# Patient Record
Sex: Female | Born: 1970 | Race: White | Hispanic: No | Marital: Single
Health system: Southern US, Community
[De-identification: ages and names within clinical notes are randomized; demographics above are authoritative.]

---

## 2018-01-04 ENCOUNTER — Emergency Department (HOSPITAL_BASED_OUTPATIENT_CLINIC_OR_DEPARTMENT_OTHER): Payer: BLUE CROSS/BLUE SHIELD

## 2018-01-04 ENCOUNTER — Emergency Department (HOSPITAL_BASED_OUTPATIENT_CLINIC_OR_DEPARTMENT_OTHER)
Admission: EM | Admit: 2018-01-04 | Discharge: 2018-01-04 | Disposition: A | Discharge status: Home / Self Care | Payer: BLUE CROSS/BLUE SHIELD | Attending: Emergency Medicine | Admitting: Emergency Medicine

## 2018-01-04 ENCOUNTER — Other Ambulatory Visit: Payer: Self-pay

## 2018-01-04 DIAGNOSIS — R112 Nausea with vomiting, unspecified: Secondary | ICD-10-CM | POA: Diagnosis not present

## 2018-01-04 DIAGNOSIS — R42 Dizziness and giddiness: Secondary | ICD-10-CM

## 2018-01-04 DIAGNOSIS — R0981 Nasal congestion: Secondary | ICD-10-CM | POA: Diagnosis not present

## 2018-01-04 DIAGNOSIS — R1013 Epigastric pain: Secondary | ICD-10-CM

## 2018-01-04 DIAGNOSIS — R51 Headache: Secondary | ICD-10-CM | POA: Diagnosis present

## 2018-01-04 DIAGNOSIS — K2970 Gastritis, unspecified, without bleeding: Secondary | ICD-10-CM

## 2018-01-04 DIAGNOSIS — R040 Epistaxis: Secondary | ICD-10-CM

## 2018-01-04 DIAGNOSIS — R0789 Other chest pain: Secondary | ICD-10-CM | POA: Diagnosis not present

## 2018-01-04 DIAGNOSIS — K529 Noninfective gastroenteritis and colitis, unspecified: Secondary | ICD-10-CM | POA: Diagnosis not present

## 2018-01-04 DIAGNOSIS — R55 Syncope and collapse: Secondary | ICD-10-CM

## 2018-01-04 LAB — COMPREHENSIVE METABOLIC PANEL
ALBUMIN: 4.4 g/dL (ref 3.5–5.0)
ALK PHOS: 66 U/L (ref 38–126)
ALT: 28 U/L (ref 14–54)
ANION GAP: 8 (ref 5–15)
AST: 16 U/L (ref 15–41)
BILIRUBIN TOTAL: 0.2 mg/dL — AB (ref 0.3–1.2)
BUN: 12 mg/dL (ref 6–20)
CO2: 23 mmol/L (ref 22–32)
Calcium: 9 mg/dL (ref 8.9–10.3)
Chloride: 105 mmol/L (ref 101–111)
Creatinine, Ser: 0.81 mg/dL (ref 0.44–1.00)
GFR calc Af Amer: >60 mL/min (ref >60)
GFR calc non Af Amer: >60 mL/min (ref >60)
GLUCOSE: 135 mg/dL — AB (ref 65–99)
POTASSIUM: 3.7 mmol/L (ref 3.5–5.1)
SODIUM: 136 mmol/L (ref 135–145)
TOTAL PROTEIN: 7.3 g/dL (ref 6.5–8.1)

## 2018-01-04 LAB — CBC WITH DIFFERENTIAL/PLATELET
BASOS ABS: 0 10*3/uL (ref 0.0–0.1)
BASOS PCT: 1 %
EOS ABS: 0.4 10*3/uL (ref 0.0–0.7)
Eosinophils Relative: 6 %
HEMATOCRIT: 43.1 % (ref 36.0–46.0)
HEMOGLOBIN: 13.9 g/dL (ref 12.0–15.0)
Lymphocytes Relative: 25 %
Lymphs Abs: 1.6 10*3/uL (ref 0.7–4.0)
MCH: 30.2 pg (ref 26.0–34.0)
MCHC: 32.3 g/dL (ref 30.0–36.0)
MCV: 93.7 fL (ref 78.0–100.0)
Monocytes Absolute: 0.4 10*3/uL (ref 0.1–1.0)
Monocytes Relative: 6 %
NEUTROS ABS: 4.1 10*3/uL (ref 1.7–7.7)
NEUTROS PCT: 62 %
Platelets: 192 10*3/uL (ref 150–400)
RBC: 4.6 MIL/uL (ref 3.87–5.11)
RDW: 13.1 % (ref 11.5–15.5)
WBC: 6.6 10*3/uL (ref 4.0–10.5)

## 2018-01-04 LAB — URINALYSIS, ROUTINE W REFLEX MICROSCOPIC
Bilirubin Urine: NEGATIVE
GLUCOSE, UA: NEGATIVE mg/dL
Hgb urine dipstick: NEGATIVE
Ketones, ur: NEGATIVE mg/dL
LEUKOCYTES UA: NEGATIVE
NITRITE: NEGATIVE
PH: 7 (ref 5.0–8.0)
Protein, ur: NEGATIVE mg/dL

## 2018-01-04 LAB — LIPASE, BLOOD: Lipase: 39 U/L (ref 11–51)

## 2018-01-04 LAB — TROPONIN I: Troponin I: <0.03 ng/mL (ref <0.03)

## 2018-01-04 MED ORDER — FLUTICASONE PROPIONATE 50 MCG/ACT NA SUSP: 2.0000 | Freq: Every day | NASAL | 0 refills | Status: AC

## 2018-01-04 MED ORDER — SODIUM CHLORIDE 0.9 % IV BOLUS
2000.0000 mL | Freq: Once | INTRAVENOUS | Status: AC
  Administered 2018-01-04: 2000 mL via INTRAVENOUS

## 2018-01-04 MED ORDER — ONDANSETRON HCL 4 MG/2ML IJ SOLN
4.0000 mg | Freq: Once | INTRAMUSCULAR | Status: AC
  Administered 2018-01-04: 4 mg via INTRAVENOUS
  Filled 2018-01-04: qty 2

## 2018-01-04 MED ORDER — RANITIDINE HCL 150 MG PO TABS: 150.0000 mg | ORAL_TABLET | Freq: Two times a day (BID) | ORAL | 0 refills | Status: AC

## 2018-01-04 MED ORDER — ONDANSETRON 4 MG PO TBDP: 4.0000 mg | ORAL_TABLET | Freq: Three times a day (TID) | ORAL | 0 refills | Status: AC | PRN

## 2018-01-04 MED ORDER — GI COCKTAIL ~~LOC~~
30.0000 mL | Freq: Once | ORAL | Status: AC
  Administered 2018-01-04: 30 mL via ORAL
  Filled 2018-01-04: qty 30

## 2018-01-04 NOTE — Discharge Instructions (Addendum)
Your work up today has been very reassuring. You likely had a vasovagal episode, which could have been triggered by your nose bleed, which could have been from nasal congestion. Use flonase and a netipot to help flush your sinuses, may use over the counter antihistamines like zyrtec/claritin/benadryl as well for additional relief. Stay very well hydrated and get plenty of rest. Use zofran as directed as needed for nausea.   Your abdominal and chest pain could be from gastritis or an ulcer. You will need to take zantac as directed, and avoid spicy/fatty/acidic foods, avoid soda/coffee/tea/alcohol. Avoid laying down flat within 30 minutes of eating. Avoid NSAIDs like ibuprofen/aleve/motrin/etc on an empty stomach. May consider using over the counter tums/maalox as needed for additional relief. Use tylenol as needed for pain.   Follow up with your regular doctor in 5-7 days for recheck of symptoms. Return to the ER for changes or worsening symptoms.

## 2018-01-04 NOTE — ED Notes (Signed)
Patient with nurse, xray delayed.

## 2018-01-04 NOTE — ED Notes (Signed)
Pt given water for PO challenge 

## 2018-01-04 NOTE — ED Provider Notes (Signed)
[facility] EMERGENCY DEPARTMENT Provider Note   CSN: [id number] Arrival date & time: 01/04/18  1016     History   Chief Complaint Chief Complaint  Patient presents with  . Headache    HPI Carolyn Ellison is a 47 y.o. 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  Patient states that when she woke up this morning to go to work she had a mild achy frontal headache that was nothing unusual and she did not think anything of it.  She went to work and around 8:30am she had her break where she ate a small meal and at that point her headache was feeling better, however when she got up from her meal she felt lightheaded and went to the restroom and saw that she had a right-sided nosebleed.  She worked on getting it controlled and eventually was able to control it with pressure, however when she got back up to go back to work she became lightheaded again and "blacked out" for a brief period of time, perhaps several minutes.  She states that she "came to" with her coworkers around her hooking her up to an EKG machine to monitor her heart rate, she does not remember the time between feeling lightheaded until waking up with her coworkers around her.  However she denies that she fell, and none of her coworkers reported that she had fallen or had any full LOC, so she is not entirely sure if she passed out or not.  When she woke up she was having "mild chest discomfort, which she describes as 4/10 constant pressure in the center of her chest, nonradiating, with no known aggravating or alleviating factors, no treatments tried prior to arrival.  She also reports feeling sweaty at that point.  Lastly she had nausea and one episode of nonbloody nonbilious emesis.  She states that her headache is completely resolved, but she continues to feel lightheaded when she stands up which improves when she sits or lays down.  She also  still has the chest pressure discomfort.  She has not had another epistaxis episode since the first 1.  She denies having any medical problems and is on no medications, no recent changes in medications.  Her PCP is her [facility] [place].  She denies any nose picking, sinus congestion, ear pain or drainage, tinnitus, cough, URI symptoms, fevers, chills, vision changes, shortness of breath, leg swelling, abdominal pain, hematemesis, diarrhea, constipation, melena, hematochezia, dysuria, hematuria, myalgias, arthralgias, numbness, tingling, focal weakness, or any other complaints at this time.  The history is provided by the patient and medical records. No language interpreter was used.  Dizziness  Quality:  Lightheadedness Severity:  Mild Onset quality:  Gradual Duration:  3 hours Timing:  Intermittent Progression:  Unchanged Chronicity:  New Context: standing up   Relieved by:  Lying down Worsened by:  Standing up Ineffective treatments:  None tried Associated symptoms: chest pain, headaches (resolved), nausea and vomiting   Associated symptoms: no blood in stool, no diarrhea, no shortness of breath, no tinnitus and no weakness   Risk factors: no multiple medications and no new medications     No past medical history on file.  There are no active problems to display for this patient.    OB History   None      Home Medications    Prior to Admission medications   Not on File  Family History No family history on file.  Social History Social History   Tobacco Use  . Smoking status: Not on file  Substance Use Topics  . Alcohol use: Not on file  . Drug use: Not on file     Allergies   Patient has no allergy information on record.   Review of Systems Review of Systems  Constitutional: Positive for diaphoresis (got sweaty). Negative for chills and fever.  HENT: Positive for nosebleeds. Negative for congestion, ear discharge, ear pain, sinus pressure, sore throat  and tinnitus.   Eyes: Negative for visual disturbance.  Respiratory: Negative for cough and shortness of breath.   Cardiovascular: Positive for chest pain. Negative for leg swelling.  Gastrointestinal: Positive for nausea and vomiting. Negative for abdominal pain, blood in stool, constipation and diarrhea.  Genitourinary: Negative for dysuria and hematuria.  Musculoskeletal: Negative for arthralgias and myalgias.  Skin: Negative for color change.  Allergic/Immunologic: Negative for immunocompromised state.  Neurological: Positive for dizziness, syncope (possible, unclear), light-headedness and headaches (resolved). Negative for weakness and numbness.  Psychiatric/Behavioral: Negative for confusion.   All other systems reviewed and are negative for acute change except as noted in the HPI.    Physical Exam Updated Vital Signs BP 112/66 (BP Location: Left Arm)   Pulse 80   Temp 98 F (36.7 C) (Oral)   Resp 16   Ht 5\' 9"  (1.753 m)   Wt 104.3 kg (230 lb)   SpO2 100%   BMI 33.97 kg/m   Physical Exam  Constitutional: She is oriented to person, place, and time. Vital signs are normal. She appears well-developed and well-nourished.  Non-toxic appearance. No distress.  Afebrile, nontoxic, NAD  HENT:  Head: Normocephalic and atraumatic.  Nose: Mucosal edema present. No rhinorrhea or nasal septal hematoma. No epistaxis.  Mouth/Throat: Uvula is midline, oropharynx is clear and moist and mucous membranes are normal. No trismus in the jaw. No uvula swelling.  Mild to moderate nasal mucosal edema and somewhat erythematous, no epistaxis, no septal hematoma. No dried blood to either nare.   Eyes: Pupils are equal, round, and reactive to light. Conjunctivae and EOM are normal. Right eye exhibits no discharge. Left eye exhibits no discharge.  PERRL, EOMI, no nystagmus, no visual field deficits   Neck: Normal range of motion. Neck supple. No spinous process tenderness and no muscular tenderness  present. No neck rigidity. Normal range of motion present.  FROM intact without spinous process TTP, no bony stepoffs or deformities, no paraspinous muscle TTP or muscle spasms. No rigidity or meningeal signs. No bruising or swelling.   Cardiovascular: Normal rate, regular rhythm, normal heart sounds and intact distal pulses. Exam reveals no gallop and no friction rub.  No murmur heard. RRR, nl s1/s2, no m/r/g, distal pulses intact, no pedal edema   Pulmonary/Chest: Effort normal and breath sounds normal. No respiratory distress. She has no decreased breath sounds. She has no wheezes. She has no rhonchi. She has no rales. She exhibits tenderness. She exhibits no crepitus, no deformity and no retraction.  CTAB in all lung fields, no w/r/r, no hypoxia or increased WOB, speaking in full sentences, SpO2 100% on RA Chest wall with mild sternal TTP without crepitus, deformities, or retractions   Abdominal: Soft. Normal appearance and bowel sounds are normal. She exhibits no distension. There is tenderness in the epigastric area. There is no rigidity, no rebound, no guarding, no CVA tenderness, no tenderness at McBurney's point and negative Murphy's sign.  Soft, nondistended, +BS  throughout, with mild epigastric TTP, no r/g/r, neg murphy's, neg mcburney's, no CVA TTP   Musculoskeletal: Normal range of motion.  MAE x4 Strength and sensation grossly intact in all extremities Distal pulses intact Gait steady although she feels lightheaded getting up from the bed  Neurological: She is alert and oriented to person, place, and time. She has normal strength. No cranial nerve deficit or sensory deficit. Coordination and gait normal. GCS eye subscore is 4. GCS verbal subscore is 5. GCS motor subscore is 6.  CN 2-12 grossly intact A&O x4 GCS 15 Sensation and strength intact Gait nonataxic including with tandem walking Coordination with finger-to-nose WNL Neg pronator drift   Skin: Skin is warm, dry and intact.  No rash noted.  Psychiatric: She has a normal mood and affect.  Nursing note and vitals reviewed.   ORTHOSTATICS: Orthostatic VS for the past 24 hrs:  BP- Lying Pulse- Lying BP- Sitting Pulse- Sitting BP- Standing at 0 minutes Pulse- Standing at 0 minutes  01/04/18 1113 106/50 81 114/66 75 102/64 92     ED Treatments / Results  Labs (all labs ordered are listed, but only abnormal results are displayed) Labs Reviewed  COMPREHENSIVE METABOLIC PANEL - Abnormal; Notable for the following components:      Result Value   Glucose, Bld 135 (*)    Total Bilirubin 0.2 (*)    All other components within normal limits  URINALYSIS, ROUTINE W REFLEX MICROSCOPIC - Abnormal; Notable for the following components:   Specific Gravity, Urine <1.005 (*)    All other components within normal limits  CBC WITH DIFFERENTIAL/PLATELET  LIPASE, BLOOD  TROPONIN I    EKG EKG Interpretation  Date/Time:  Wednesday January 04 2018 11:26:27 EDT Ventricular Rate:  73 PR Interval:    QRS Duration: 75 QT Interval:  383 QTC Calculation: 422 R Axis:   70 Text Interpretation:  Sinus rhythm No old tracing to compare Confirmed by Marily Memos [phone]) on 01/04/2018 11:40:27 AM Also confirmed by Marily Memos [phone]), editor [place], Tamera Punt ([id number])  on 01/04/2018 11:48:03 AM   Radiology Dg Chest 2 View  Result Date: 01/04/2018 CLINICAL DATA:  Chest pressure EXAM: CHEST - 2 VIEW COMPARISON:  None. FINDINGS: Lungs are clear. Heart size and pulmonary vascularity are normal. No adenopathy. No pneumothorax. No bone lesions. IMPRESSION: No edema or consolidation. Electronically Signed   By: Bretta Bang III M.D.   On: 01/04/2018 11:37    Procedures Procedures (including critical care time)  Medications Ordered in ED Medications  sodium chloride 0.9 % bolus 2,000 mL (2,000 mLs Intravenous New Bag/Given 01/04/18 1109)  ondansetron (ZOFRAN) injection 4 mg (4 mg Intravenous Given 01/04/18 1109)  gi cocktail  (Maalox,Lidocaine,Donnatal) (30 mLs Oral Given 01/04/18 1118)     Initial Impression / Assessment and Plan / ED Course  I have reviewed the triage vital signs and the nursing notes.  Pertinent labs & imaging results that were available during my care of the patient were reviewed by me and considered in my medical decision making (see chart for details).     47 y.o. female here with c/o lightheadedness today after waking up with a headache however that resolved and then she had a nose bleed, felt lightheaded, and had ?LOC briefly, then had n/v x1 and "woke up" with a discomfort in her chest that she describes as a pressure. On exam, no focal neuro deficits, nose congested but without ongoing epistaxis, mild chest wall TTP and epigastric TTP, remainder of abdomen  soft and nontender and nonperitoneal, neg murphy's sign; pt gets lightheaded standing up but is able to ambulate with steady gait including with tandem gait. Overall very reassuring exam, it sounds like she had a vasovagal episode from the nosebleed, which could have been from nasal mucosal edema. Will get labs, EKG, CXR, and orthostatics, however doubt need for head imaging at this time. Will give GI cocktail, zofran, fluids, and reassess shortly.   1:43 PM CBC w/diff WNL. CMP with marginally elevated gluc 135 likely from stress surge response, otherwise unremarkable. Lipase WNL. U/A unremarkable. Trop neg. EKG unremarkable and nonischemic. CXR negative. Orthostatics negative. Pt feeling better and tolerating PO well here. Overall, symptoms consistent with vasovagal episode from nosebleed possibly from sinus congestion. Will send home with zofran and flonase, advised adequate hydration and rest, will start on zantac given her epigastric discomfort and chest discomfort which could be GERD/gastritis. Advised diet/lifestyle modifications for this, tylenol use with sparing use of NSAIDs. F/up with PCP in 1wk for recheck of symptoms. I explained the  diagnosis and have given explicit precautions to return to the ER including for any other new or worsening symptoms. The patient understands and accepts the medical plan as it's been dictated and I have answered their questions. Discharge instructions concerning home care and prescriptions have been given. The patient is STABLE and is discharged to home in good condition.    Final Clinical Impressions(s) / ED Diagnoses   Final diagnoses:  Episodic lightheadedness  Nasal congestion  Epistaxis  Vasovagal near syncope  Nausea and vomiting in adult patient  Epigastric abdominal pain  Atypical chest pain  Gastritis, presence of bleeding unspecified, unspecified chronicity, unspecified gastritis type    ED Discharge Orders        Ordered    ondansetron (ZOFRAN ODT) 4 MG disintegrating tablet  Every 8 hours PRN     01/04/18 1252    ranitidine (ZANTAC) 150 MG tablet  2 times daily     01/04/18 1252    fluticasone (FLONASE) 50 MCG/ACT nasal spray  Daily     01/04/18 [id number]       Deena Shaub, [place]cedes, [place] JerseyPA-C 01/04/18 1344    Mesner, Barbara CowerJason, MD 01/04/18 1426

## 2019-04-07 IMAGING — DX DG CHEST 2V
2 series · 2 of 2 positions shown · non-contrast
Comparison: None.

CLINICAL DATA: Chest pressure

EXAM:
CHEST - 2 VIEW

[chest pa]
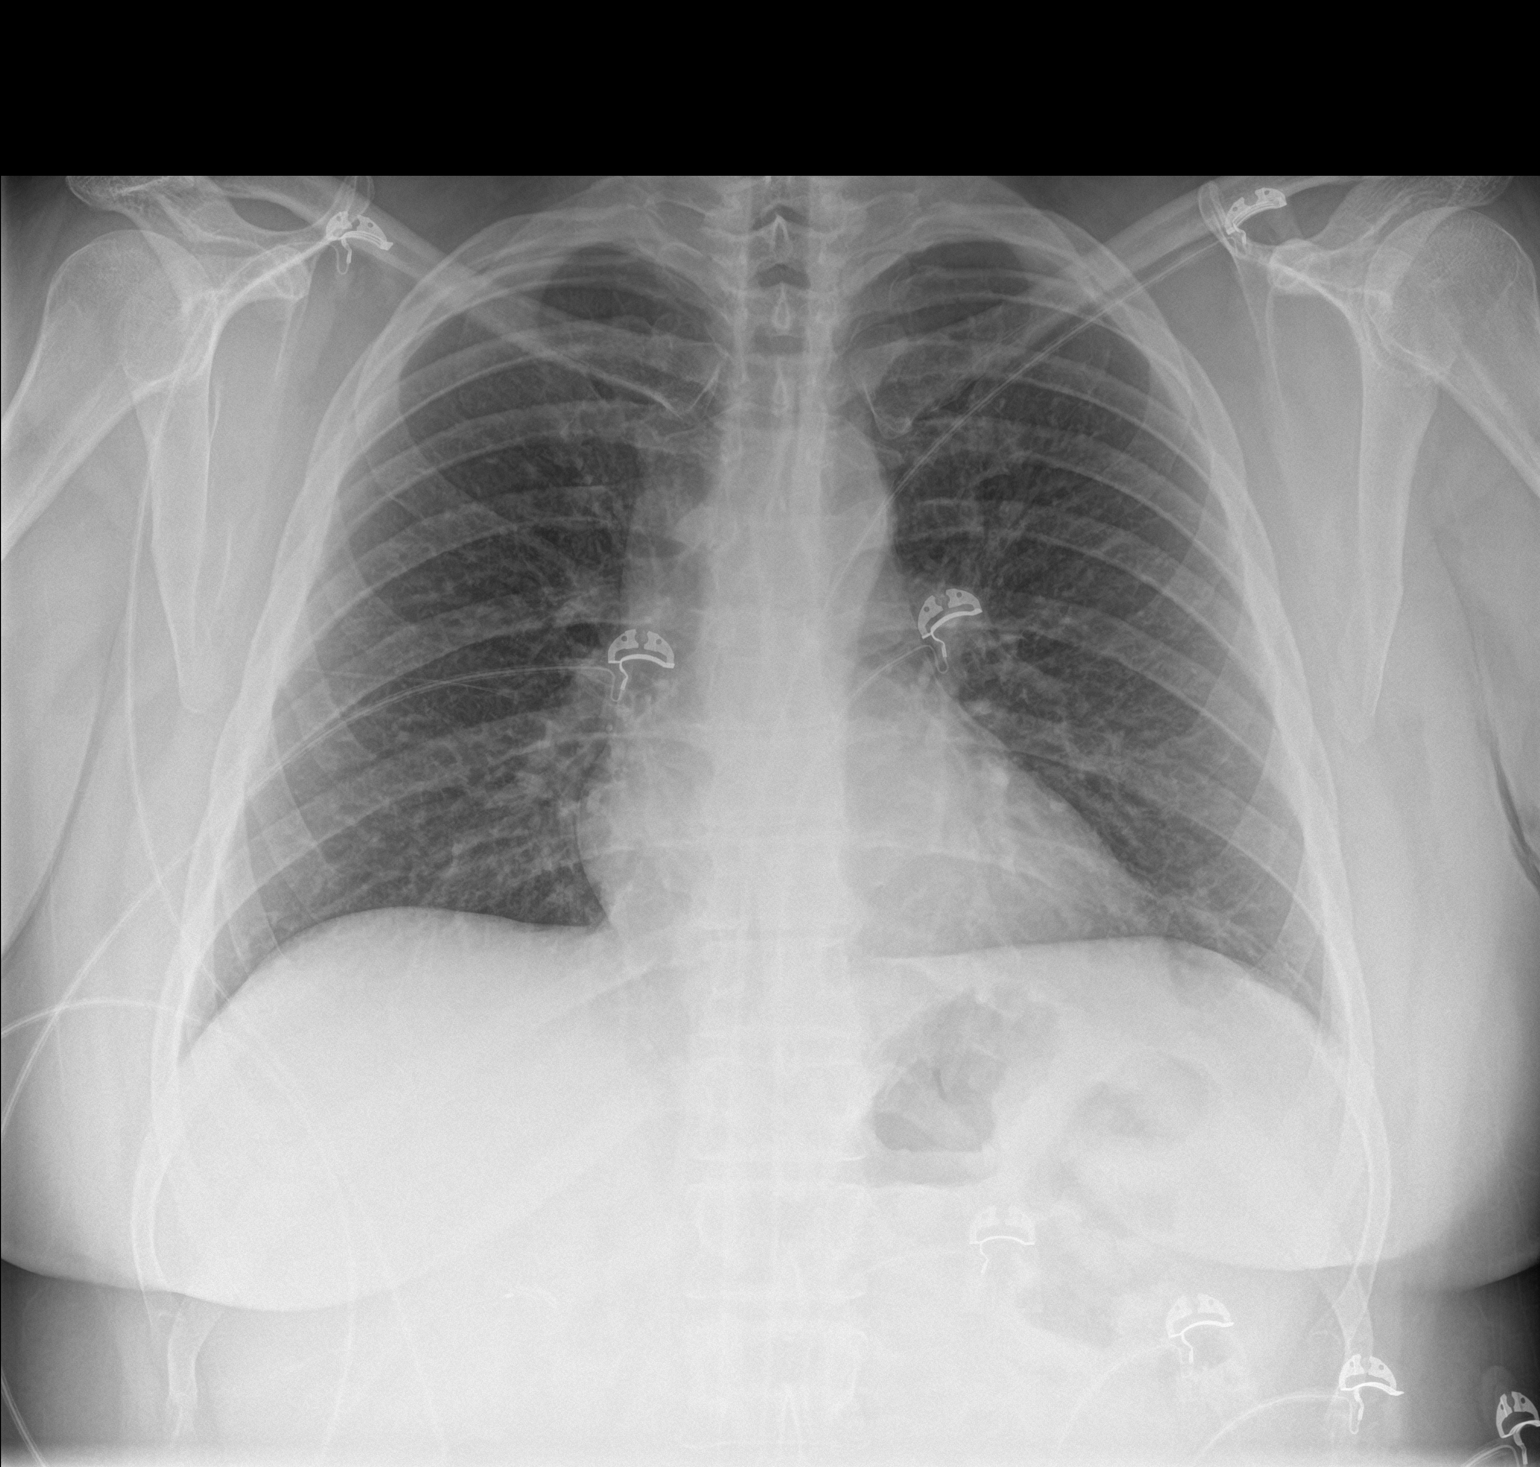

[chest lat]
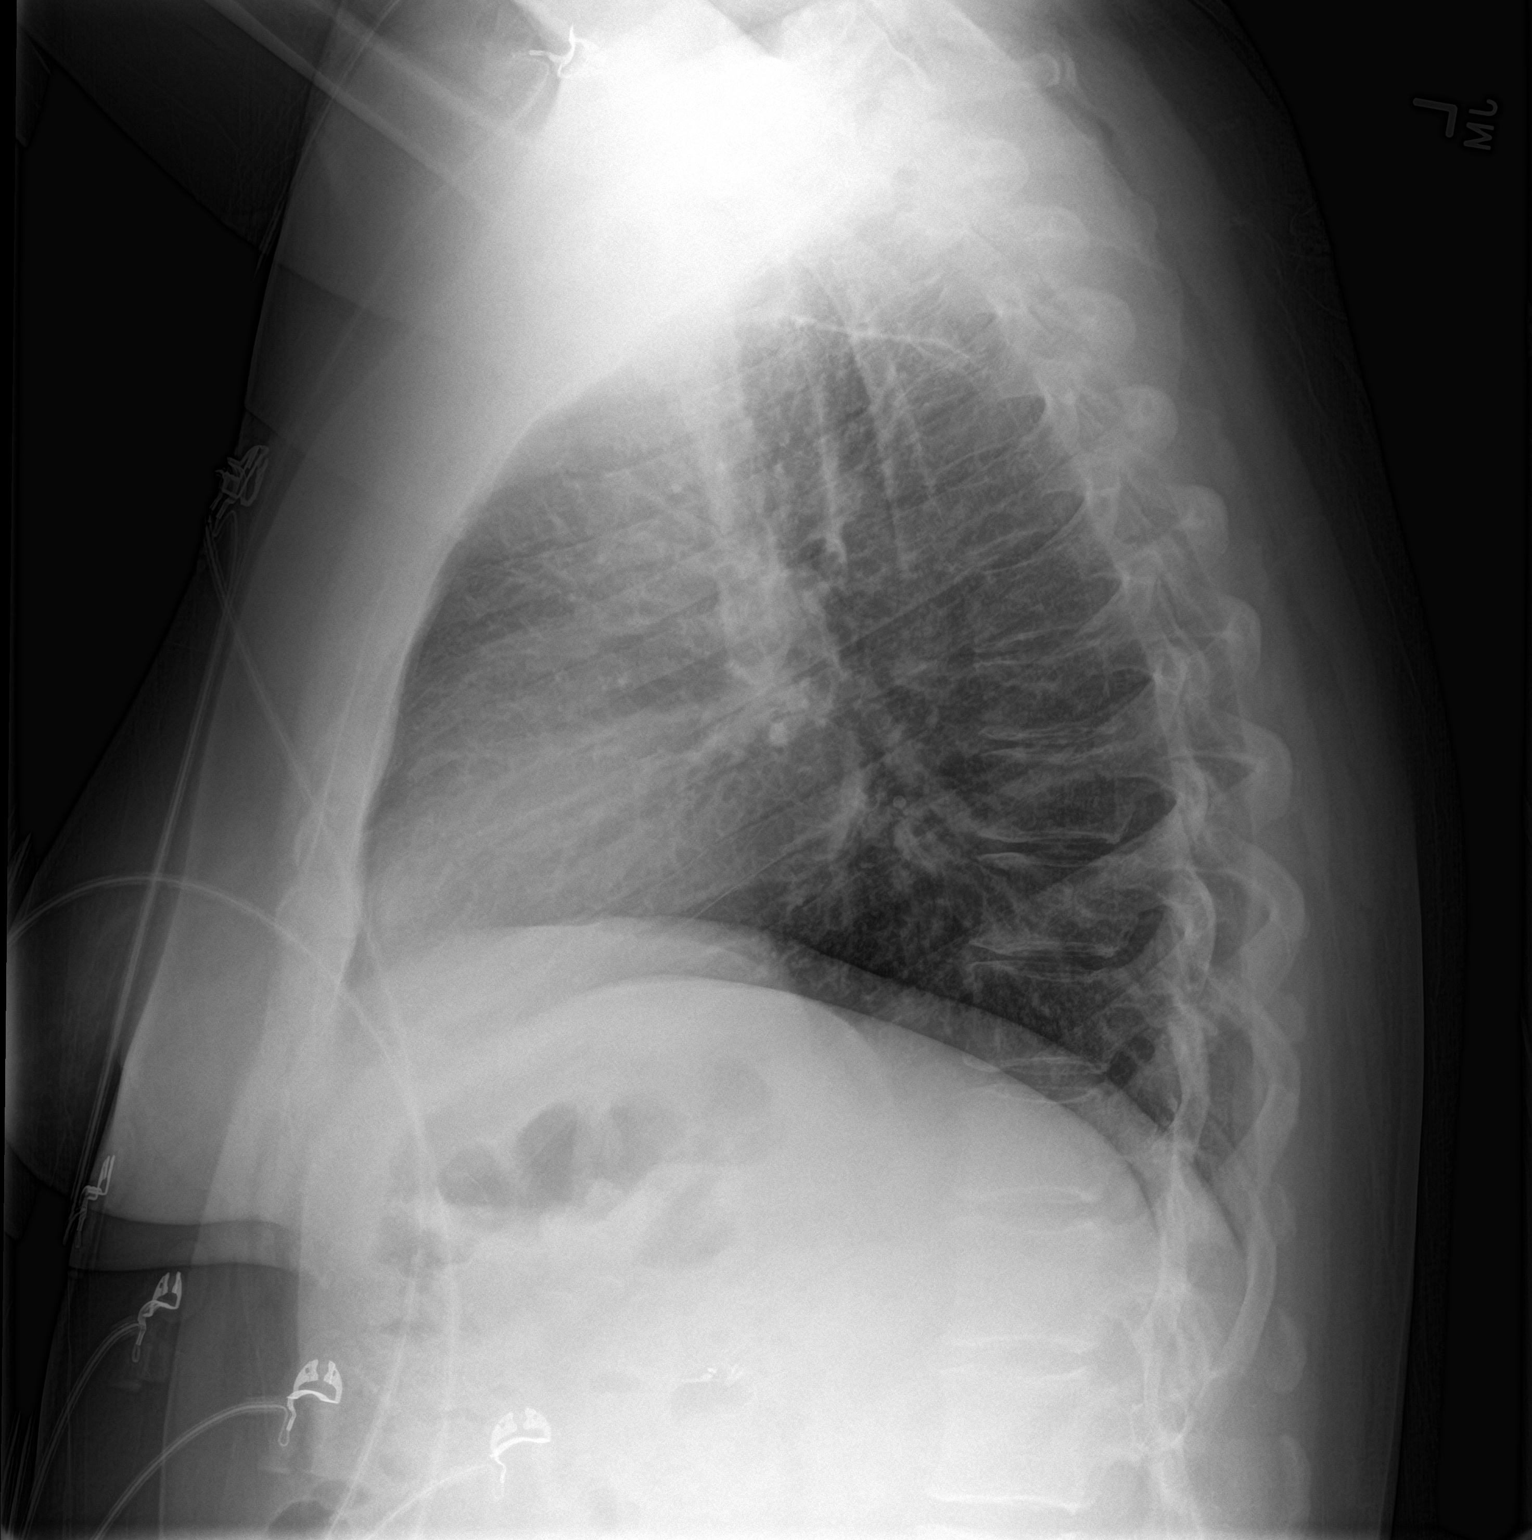

[2 of 2 positions shown; findings below may reference images not displayed]

FINDINGS: Lungs are clear. Heart size and pulmonary vascularity are normal. No
adenopathy. No pneumothorax. No bone lesions.
IMPRESSION: No edema or consolidation.

## 2019-06-17 DIAGNOSIS — R7303 Prediabetes: Secondary | ICD-10-CM

## 2019-06-17 DIAGNOSIS — R55 Syncope and collapse: Secondary | ICD-10-CM

## 2019-06-17 DIAGNOSIS — E039 Hypothyroidism, unspecified: Secondary | ICD-10-CM

## 2019-06-17 DIAGNOSIS — R079 Chest pain, unspecified: Secondary | ICD-10-CM
